# Patient Record
Sex: Male | Born: 1990 | Hispanic: No | Marital: Married | Smoking: Current every day smoker
Health system: Southern US, Community
[De-identification: ages and names within clinical notes are randomized; demographics above are authoritative.]

## PROBLEM LIST (undated history)

## (undated) DIAGNOSIS — I10 Essential (primary) hypertension: Secondary | ICD-10-CM

## (undated) DIAGNOSIS — R251 Tremor, unspecified: Secondary | ICD-10-CM

## (undated) HISTORY — PX: BACK SURGERY: SHX140

## (undated) HISTORY — DX: Tremor, unspecified: R25.1

---

## 2016-11-23 ENCOUNTER — Ambulatory Visit (HOSPITAL_BASED_OUTPATIENT_CLINIC_OR_DEPARTMENT_OTHER): Payer: Worker's Compensation | Admitting: Physical Medicine & Rehabilitation

## 2016-11-23 ENCOUNTER — Encounter: Payer: Self-pay | Admitting: Physical Medicine & Rehabilitation

## 2016-11-23 ENCOUNTER — Encounter: Payer: Worker's Compensation | Attending: Physical Medicine & Rehabilitation

## 2016-11-23 VITALS — BP 141/90 | HR 76

## 2016-11-23 DIAGNOSIS — Z5181 Encounter for therapeutic drug level monitoring: Secondary | ICD-10-CM | POA: Diagnosis not present

## 2016-11-23 DIAGNOSIS — M5416 Radiculopathy, lumbar region: Secondary | ICD-10-CM | POA: Diagnosis not present

## 2016-11-23 DIAGNOSIS — M549 Dorsalgia, unspecified: Secondary | ICD-10-CM | POA: Diagnosis not present

## 2016-11-23 DIAGNOSIS — M961 Postlaminectomy syndrome, not elsewhere classified: Secondary | ICD-10-CM | POA: Diagnosis not present

## 2016-11-23 MED ORDER — BACLOFEN 10 MG PO TABS: 10.0000 mg | ORAL_TABLET | Freq: Three times a day (TID) | ORAL | 1 refills | Status: DC

## 2016-11-23 MED ORDER — PREGABALIN 75 MG PO CAPS: 75.0000 mg | ORAL_CAPSULE | Freq: Two times a day (BID) | ORAL | 1 refills | Status: DC

## 2016-11-23 NOTE — Patient Instructions (Addendum)
You have both muscle spasm pain in the low back area and will restart baclofen 10 mg 3 times a day  You also have nerve pain, right L4 primarily and partially at L3 and L5 Will start Lyrica 75 mg twice a day, but may need to increase this.  If you have problem resting and needs something to help with pain at night after one week of this medication trial, please call our office so we can call in tramadol 50 mg at night  Continue current work restrictions 50 pounds max lifting, no standing for more than 1 hour. These are permanent restrictions. 11/23/2016

## 2016-11-23 NOTE — Progress Notes (Signed)
Subjective:    Patient ID: Ethan Jacobs, male    DOB: 1990-07-26, 26 y.o.   MRN: [id number]  HPI Date of injury 08/27/2015 Chief complaint is low back pain as well as right lower extremity pain from the hip to the toes , excluding fourth and fifth toes. Patient's symptoms were initially mild but then progressed. . Underwent neurosurgical evaluation 11/28/2015. Surgical and nonsurgical options were discussed. Gabapentin was added on to medication regimen. Motor strength was 4/5 in the hip flexor and anterior tibialis on the right side as well as right peroneal. Diagnosed with a right L4 radiculopathy. Was treated with physical therapy and then underwent epidural injection on 12/18/2015 Worsening of pain noted on a physical therapy visit dated 01/01/2016 Patient had some increased weakness and numbness. Was admitted to [facility] on 01/02/2016 Patient had MRI findings of L3-L4 disc protrusion causing severe right lateral recess as well as central spinal canal stenosis. There was impingement of the cauda equina. There was no bowel or bladder incontinence. However. Underwent right L3-4 decompression and microdiscectomy Postoperatively, left lower extremity symptoms resolved.  Right lower extremity symptoms have persisted. Postoperative pain was treated with oxycodone, Robaxin and tizanidine Also received postoperative physical therapy.  Last neurosurgery visit was 08/03/2016. The patient at that time had return to work with restrictions. The patient was not getting good relief with gabapentin or Cymbalta. A prescription for Nucynta was written and patient was maintained on baclofen. The Cymbalta was not tolerated, crossed "crazy dreams". Motor exam was 5/5 bilateral lower extremities. Diagnosed with persistent lumbar radiculopathy causing right sided lower extremity pain. Was felt to be at a maximum medical improvement postsurgically. And was continued on permanent lifting restrictions of 50  pounds and no standing greater than 1 hour. Patient has tried some Tylenol as well as ibuprofen, which gives some partial relief. However, the pain shooting down the right leg, sometimes interrupts sleep at night. Had Baclofen prescribed for spasms Pain Inventory Average Pain 8 Pain Right Now 9 My pain is constant, tingling and aching  In the last 24 hours, has pain interfered with the following? General activity 3 Relation with others 3 Enjoyment of life 3 What TIME of day is your pain at its worst? evening Sleep (in general) Poor  Pain is worse with: walking, bending, sitting and standing Pain improves with: rest and medication Relief from Meds: 8  Mobility walk without assistance ability to climb steps?  yes do you drive?  yes  Function employed # of hrs/week 40  Neuro/Psych numbness tremor tingling trouble walking spasms  Prior Studies Any changes since last visit?  no  Physicians involved in your care Any changes since last visit?  no   Family History  Problem Relation Age of Onset  . Cancer Maternal Grandfather    Social History   Social History  . Marital status: Married    Spouse name: N/A  . Number of children: N/A  . Years of education: N/A   Social History Main Topics  . Smoking status: Current Every Day Smoker    Packs/day: 1.00    Years: 5.00    Types: Cigarettes  . Smokeless tobacco: Never Used  . Alcohol use No  . Drug use: No  . Sexual activity: Yes    Birth control/ protection: Condom   Other Topics Concern  . None   Social History Narrative  . None   Past Surgical History:  Procedure Laterality Date  . BACK SURGERY Bilateral  Past Medical History:  Diagnosis Date  . Tremor    BP (!) 141/90   Pulse 76   SpO2 95%   Opioid Risk Score:   Fall Risk Score:  `1  Depression screen PHQ 2/9  No flowsheet data found.  Review of Systems  Constitutional: Negative.   HENT: Negative.   Eyes: Negative.   Respiratory:  Negative.   Cardiovascular: Negative.   Gastrointestinal: Negative.   Endocrine: Negative.   Genitourinary: Negative.   Musculoskeletal: Negative.   Skin: Negative.   Allergic/Immunologic: Negative.   Neurological: Negative.   Hematological: Negative.   Psychiatric/Behavioral: Negative.   All other systems reviewed and are negative.      Objective:   Physical Exam  Constitutional: He is oriented to person, place, and time. He appears well-developed and well-nourished.  HENT:  Head: Normocephalic and atraumatic.  Eyes: Conjunctivae and EOM are normal. Pupils are equal, round, and reactive to light.  Neck: Normal range of motion.  Cardiovascular: Normal rate, regular rhythm and normal heart sounds.  Exam reveals no friction rub.   No murmur heard. Pulmonary/Chest: Effort normal and breath sounds normal. No respiratory distress. He has no wheezes.  Abdominal: Soft. Bowel sounds are normal. He exhibits no distension. There is no tenderness.  Neurological: He is alert and oriented to person, place, and time.  Reflex Scores:      Patellar reflexes are 1+ on the right side and 1+ on the left side.      Achilles reflexes are 2+ on the right side and 2+ on the left side. Psychiatric: He has a normal mood and affect.  Nursing note and vitals reviewed.  5/5 strength bilateral deltoid, biceps. Passive grip, hip flexion, extension, ankle dorsiflexion. On manual muscle testing, however, on partial squat. He did have some weakness with single leg squat on the right side compared to left side.  Sensation reduced right L3, L4, L5 dermatomal distribution      Assessment & Plan:  1.  Chronic radicular pain. Primarily right L4 but has some findings in the right L3 and right L5 dermatomal distribution. This is residual from the nerve root compression resulting from the L3-L4 disc protrusion We will start Lyrica 75 mg twice a day and explained the probable need to titrate upward from there.  2.  Chronic low back pain. This appears to be peri-incisional and has likely a muscular component. He has had some relief with baclofen in the past and will resume this at 10 mg 3 times a day  Patient states that he has tried a tramadol in the past that has been helpful for him, this may be a treatment option. If the above regimen is effective  Discussed my findings with the patient and then separately with the patient as well as his nurse case manager Lorinda Creedheri Yates RN, CM [phone]

## 2016-11-30 ENCOUNTER — Telehealth: Payer: Self-pay

## 2016-11-30 MED ORDER — TRAMADOL HCL 50 MG PO TABS: 50.0000 mg | ORAL_TABLET | Freq: Every day | ORAL | 0 refills | Status: DC

## 2016-11-30 NOTE — Telephone Encounter (Signed)
Patient called to start using tramadol 50 mg at night, per last note:   If you have problem resting and needs something to help with pain at night after one week of this medication trial, please call our office so we can call in tramadol 50 mg at night  Tramadol 50mg  QHS was prescribed for patient.

## 2016-12-25 ENCOUNTER — Other Ambulatory Visit: Payer: Self-pay | Admitting: Physical Medicine & Rehabilitation

## 2017-01-04 ENCOUNTER — Ambulatory Visit (HOSPITAL_BASED_OUTPATIENT_CLINIC_OR_DEPARTMENT_OTHER): Payer: Worker's Compensation | Admitting: Physical Medicine & Rehabilitation

## 2017-01-04 ENCOUNTER — Encounter: Payer: Worker's Compensation | Attending: Physical Medicine & Rehabilitation

## 2017-01-04 ENCOUNTER — Encounter: Payer: Self-pay | Admitting: Physical Medicine & Rehabilitation

## 2017-01-04 VITALS — BP 142/97 | HR 89

## 2017-01-04 DIAGNOSIS — M961 Postlaminectomy syndrome, not elsewhere classified: Secondary | ICD-10-CM

## 2017-01-04 DIAGNOSIS — M549 Dorsalgia, unspecified: Secondary | ICD-10-CM | POA: Insufficient documentation

## 2017-01-04 DIAGNOSIS — M5416 Radiculopathy, lumbar region: Secondary | ICD-10-CM

## 2017-01-04 DIAGNOSIS — G8928 Other chronic postprocedural pain: Secondary | ICD-10-CM | POA: Diagnosis not present

## 2017-01-04 MED ORDER — TRAMADOL HCL 50 MG PO TABS: 50.0000 mg | ORAL_TABLET | Freq: Two times a day (BID) | ORAL | 1 refills | Status: DC

## 2017-01-04 MED ORDER — BACLOFEN 10 MG PO TABS: 10.0000 mg | ORAL_TABLET | Freq: Two times a day (BID) | ORAL | 1 refills | Status: DC

## 2017-01-04 MED ORDER — PREGABALIN 100 MG PO CAPS: 100.0000 mg | ORAL_CAPSULE | Freq: Two times a day (BID) | ORAL | 1 refills | Status: DC

## 2017-01-04 NOTE — Patient Instructions (Signed)
Will increase Lyrica to 100mg  BID Tramadol 50mg  BID

## 2017-01-04 NOTE — Progress Notes (Signed)
Subjective:    Patient ID: Ethan Jacobs, male    DOB: 1990/08/01, 26 y.o.   MRN: [id number] See initial visit 11/23/2016 ...the patient was admitted to [facility] on 01/02/2016 Patient had MRI findings of L3-L4 disc protrusion causing severe right lateral recess as well as central spinal canal stenosis. There was impingement of the cauda equina. There was no bowel or bladder incontinence. However. Underwent right L3-4 decompression and microdiscectomy Postoperatively, left lower extremity symptoms resolved.  Right lower extremity symptoms have persisted. HPI Lyrica initially helpful but became less effective and takes at 530am and then 12 noon Pain is not as bad at night  Back pain is ~5/10 during the day, takes tramadol only at night  Baclofen 10mg  Rx was lost and not filled.  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 General activity 5 Relation with others 0 Enjoyment of life 7 What TIME of day is your pain at its worst? all Sleep (in general) Fair  Pain is worse with: walking, bending, sitting and standing Pain improves with: medication Relief from Meds: 6  Mobility walk without assistance how many minutes can you walk? 30 ability to climb steps?  yes do you drive?  yes  Function employed # of hrs/week 45  Neuro/Psych weakness numbness tremor tingling spasms  Prior Studies Any changes since last visit?  no  Physicians involved in your care Any changes since last visit?  no   Family History  Problem Relation Age of Onset  . Cancer Maternal Grandfather    Social History   Social History  . Marital status: Married    Spouse name: N/A  . Number of children: N/A  . Years of education: N/A     Social History Main Topics  . Smoking status: Current Every Day Smoker    Packs/day: 1.00    Years: 5.00    Types: Cigarettes  . Smokeless tobacco: Never Used  . Alcohol use No  . Drug use: No  . Sexual activity: Yes    Birth control/ protection: Condom   Other Topics Concern  . None   Social History Narrative  . None   Past Surgical History:  Procedure Laterality Date  . BACK SURGERY Bilateral    Past Medical History:  Diagnosis Date  . Tremor    BP (!) 142/97   Pulse 89   SpO2 96%   Opioid Risk Score:   Fall Risk Score:  `1  Depression screen PHQ 2/9  Depression screen [facility] 2/9 01/04/2017 11/23/2016  Decreased Interest 0 3  Down, Depressed, Hopeless 0 0  PHQ - 2 Score 0 3  Altered sleeping - 3  Tired, decreased energy - 3  Change in appetite - 1  Feeling bad or failure about yourself  - 1  Trouble concentrating - 0  Moving slowly or fidgety/restless - 0  Suicidal thoughts - 0  PHQ-9 Score - 11  Difficult doing work/chores - Very difficult    Review of Systems  HENT: Negative.   Eyes: Negative.   Respiratory: Negative.   Cardiovascular: Negative.   Gastrointestinal: Negative.   Endocrine: Negative.   Genitourinary: Negative.   Musculoskeletal:  Spasms  Skin: Negative.   Allergic/Immunologic: Negative.   Neurological: Positive for weakness and numbness.       Tingling  Hematological: Negative.   Psychiatric/Behavioral: Negative.   All other systems reviewed and are negative.      Objective:   Physical Exam  Constitutional: He is oriented to person, place, and time. He appears well-developed and well-nourished. No distress.  HENT:  Head: Normocephalic and atraumatic.  Eyes: Pupils are equal, round, and reactive to light. Conjunctivae and EOM are normal.  Musculoskeletal:       Thoracic back: He exhibits normal range of motion, no tenderness and no deformity.       Lumbar back: He exhibits normal range of motion, no tenderness and no  deformity.  No tenderness to palpation. Lumbar spine  Neurological: He is alert and oriented to person, place, and time.  Reflex Scores:      Patellar reflexes are 2+ on the right side and 2+ on the left side.      Achilles reflexes are 2+ on the right side and 2+ on the left side. Psychiatric: He has a normal mood and affect. His behavior is normal. Judgment and thought content normal.  Nursing note and vitals reviewed.   Decreased sensation to pinprick vibration, right L3, L4, L5-S1 distribution.      Assessment & Plan:   1. Chronic right lower extremity radicular pain, diagnosed with L4 does have some more diffuse sensory complaints. Getting good relief with Lyrica but need to increase from 75 mg twice a day to 100 mg twice a day. No current side effects.  2. Chronic low back pain. Lumbar postlaminectomy syndrome, tramadol, helping for nighttime pain, but not taken during the day.  will prescribe tramadol 50 g twice a day  Patient also complains of back spasms which occur in the afternoon and evening hours. Will prescribe baclofen 10 mg twice a day  Return to clinic 6 weeks  Called case manager Lorinda Creedheri Yates. Discussed recommendations in presence of the patient. Fax [phone]

## 2017-01-24 ENCOUNTER — Other Ambulatory Visit: Payer: Self-pay | Admitting: Family Medicine

## 2017-01-24 ENCOUNTER — Ambulatory Visit
Admission: RE | Admit: 2017-01-24 | Discharge: 2017-01-24 | Disposition: A | Discharge status: Home / Self Care | Payer: Worker's Compensation | Source: Ambulatory Visit | Attending: Family Medicine | Admitting: Family Medicine

## 2017-01-24 DIAGNOSIS — M545 Low back pain: Secondary | ICD-10-CM

## 2017-01-28 ENCOUNTER — Telehealth: Payer: Self-pay | Admitting: Physical Medicine & Rehabilitation

## 2017-01-28 MED ORDER — MELOXICAM 7.5 MG PO TABS: 7.5000 mg | ORAL_TABLET | Freq: Every day | ORAL | 0 refills | Status: DC

## 2017-01-28 NOTE — Telephone Encounter (Signed)
Notified and order sent to pharmacy.

## 2017-01-28 NOTE — Telephone Encounter (Signed)
Patient is requesting something stronger than Tramadol and Baclofen.  Patient has hurt himself again at work L4,5 and he doesn't have an appointment with his specialist until next week.  Please call patient.

## 2017-01-28 NOTE — Telephone Encounter (Signed)
Please advise.  His next appt here is 8//28/18.

## 2017-01-28 NOTE — Telephone Encounter (Signed)
Call in meloxicam 7.5 milligrams daily, take with food. Continue other current medications, advice to remain active, may use heating pad for 30 minutes every 2 hours as needed

## 2017-02-10 ENCOUNTER — Telehealth: Payer: Self-pay

## 2017-02-10 NOTE — Telephone Encounter (Signed)
Patients NCM called, Lorinda Creed [phone], stating that recently patient has had an exacerbation of pain in his back, he was sent to his neurologist where they did a back X-ray and is recommending an ESI, she has the order and is wanting to know if we can do the injections. Order is being faxed to [place] now.

## 2017-02-10 NOTE — Telephone Encounter (Signed)
We can schedule for epidural steroid injection next available

## 2017-02-11 NOTE — Telephone Encounter (Signed)
Injection scheduled for 8/28 with Dr. Wynn Banker

## 2017-02-15 ENCOUNTER — Encounter: Payer: Self-pay | Admitting: Physical Medicine & Rehabilitation

## 2017-02-15 ENCOUNTER — Encounter: Payer: Worker's Compensation | Attending: Physical Medicine & Rehabilitation

## 2017-02-15 ENCOUNTER — Ambulatory Visit (HOSPITAL_BASED_OUTPATIENT_CLINIC_OR_DEPARTMENT_OTHER): Payer: Worker's Compensation | Admitting: Physical Medicine & Rehabilitation

## 2017-02-15 VITALS — BP 136/93 | HR 97 | Resp 14

## 2017-02-15 DIAGNOSIS — M961 Postlaminectomy syndrome, not elsewhere classified: Secondary | ICD-10-CM

## 2017-02-15 DIAGNOSIS — M549 Dorsalgia, unspecified: Secondary | ICD-10-CM | POA: Insufficient documentation

## 2017-02-15 MED ORDER — TRAMADOL HCL 50 MG PO TABS: 50.0000 mg | ORAL_TABLET | Freq: Two times a day (BID) | ORAL | 1 refills | Status: DC

## 2017-02-15 NOTE — Patient Instructions (Signed)

## 2017-02-15 NOTE — Progress Notes (Signed)
  PROCEDURE RECORD [place]   Name: Ethan Jacobs DOB:[birth year] MRN: [id number]  Date:02/15/2017  Physician: Claudette Laws, MD    Nurse/CMA: Dayshaun Whobrey, CMA  Allergies: Not on File  Consent Signed: Yes.    Is patient diabetic? No.  CBG today?   Pregnant: No. LMP: No LMP for male patient. (age 28-55)  Anticoagulants: no Anti-inflammatory: no Antibiotics: no  Procedure: transforaminal epidural steroid injection  Position: Prone Start Time: 9:16 am  End Time: 9:21am  Fluoro Time: 27  RN/CMA Ryenne Lynam, CMA Sarenity Ramaker, CMA    Time 9:00am 9:29am    BP 139/93 132/91    Pulse 97 87    Respirations 14 14    O2 Sat 97 96    S/S 6 6    Pain Level 9/10 9/10     D/C home with wife, [name] & O X 3, D/C instructions reviewed, and sits independently.

## 2017-02-15 NOTE — Progress Notes (Signed)
Right L4 Lumbar transforaminal epidural steroid injection under fluoroscopic guidance  Indication: Lumbosacral radiculitis is not relieved by medication management or other conservative care and interfering with self-care and mobility.   Informed consent was obtained after describing risk and benefits of the procedure with the patient, this includes bleeding, bruising, infection, paralysis and medication side effects.  The patient wishes to proceed and has given written consent.  Patient was placed in prone position.  The lumbar area was marked and prepped with Betadine.  It was entered with a 25-gauge 1-1/2 inch needle and one mL of 1% lidocaine was injected into the skin and subcutaneous tissue.  Then a 22-gauge 5inch spinal needle was inserted into the Right L4-5 intervertebral foramen under AP, lateral, and oblique view.  Then a solution containing one mL of 10 mg per mL dexamethasone and 2 mL of 1% lidocaine was injected.  The patient tolerated procedure well.  Post procedure instructions were given.  Please see post procedure form. 

## 2017-02-17 ENCOUNTER — Other Ambulatory Visit: Payer: Self-pay | Admitting: Physical Medicine & Rehabilitation

## 2017-02-18 ENCOUNTER — Telehealth: Payer: Self-pay

## 2017-02-18 NOTE — Telephone Encounter (Signed)
[facility] pharmacy called today, stated patient is requesting medication of tramadol too soon, NCCSR pulled and patient filled tramadol on 02/06/17 for 53 tabs and on 01/31/17 for 7 tabs.   Pharmacist states patient told him that he was given permission to increase his dosage to take 1 tab 4 times daily due to increased pain.  No mention on any notes, refills, or phone messages about this increase  Please verify this information.

## 2017-02-21 ENCOUNTER — Other Ambulatory Visit: Payer: Self-pay | Admitting: Physical Medicine & Rehabilitation

## 2017-02-22 NOTE — Telephone Encounter (Signed)
I do not recall recommending a decrease in dose from 2 tramadol per day to 4 tramadol per day. We may call in tramadol 50 mg 3 times a day #90, no refills with one-month follow-up

## 2017-02-23 MED ORDER — TRAMADOL HCL 50 MG PO TABS
50.0000 mg | ORAL_TABLET | Freq: Three times a day (TID) | ORAL | 1 refills | Status: DC
Start: 2017-02-23 — End: 2017-03-14

## 2017-02-24 ENCOUNTER — Other Ambulatory Visit: Payer: Self-pay | Admitting: Physical Medicine & Rehabilitation

## 2017-02-24 NOTE — Telephone Encounter (Signed)
I contacted the patient and educated on being responsible with pain medication. I stressed due not take more than what the doctor prescribes. I ordered the medication and informed.  I failed to mention that he needs to schedule a one month follow up.  I will call him back 02/25/2017

## 2017-02-25 NOTE — Telephone Encounter (Signed)
Contacted patient made an appointment for September 10th at 9:00am

## 2017-02-28 ENCOUNTER — Encounter: Payer: Worker's Compensation | Attending: Physical Medicine & Rehabilitation

## 2017-02-28 ENCOUNTER — Ambulatory Visit: Payer: Worker's Compensation | Admitting: Physical Medicine & Rehabilitation

## 2017-02-28 DIAGNOSIS — M549 Dorsalgia, unspecified: Secondary | ICD-10-CM | POA: Insufficient documentation

## 2017-03-07 ENCOUNTER — Other Ambulatory Visit: Payer: Self-pay | Admitting: Physical Medicine & Rehabilitation

## 2017-03-14 ENCOUNTER — Encounter: Payer: Self-pay | Admitting: Physical Medicine & Rehabilitation

## 2017-03-14 ENCOUNTER — Ambulatory Visit (HOSPITAL_BASED_OUTPATIENT_CLINIC_OR_DEPARTMENT_OTHER): Payer: Worker's Compensation | Admitting: Physical Medicine & Rehabilitation

## 2017-03-14 ENCOUNTER — Encounter: Payer: Worker's Compensation | Attending: Physical Medicine & Rehabilitation

## 2017-03-14 VITALS — BP 141/95 | HR 94

## 2017-03-14 DIAGNOSIS — F17210 Nicotine dependence, cigarettes, uncomplicated: Secondary | ICD-10-CM | POA: Insufficient documentation

## 2017-03-14 DIAGNOSIS — M961 Postlaminectomy syndrome, not elsewhere classified: Secondary | ICD-10-CM | POA: Insufficient documentation

## 2017-03-14 MED ORDER — TRAMADOL HCL 50 MG PO TABS: 100.0000 mg | ORAL_TABLET | Freq: Three times a day (TID) | ORAL | 1 refills | Status: DC

## 2017-03-14 MED ORDER — PREGABALIN 150 MG PO CAPS: 150.0000 mg | ORAL_CAPSULE | Freq: Two times a day (BID) | ORAL | 1 refills | Status: DC

## 2017-03-14 NOTE — Patient Instructions (Signed)
I will review FCE as well as job requirements and can make further adjustments based on that. I do not see any new issues going on. I do think there is some medication changes that we can make such as increasing the Lyrica to 150 mg and doing an L3 epidural on the right side.

## 2017-03-14 NOTE — Progress Notes (Signed)
Subjective:    Patient ID: Ethan Jacobs, male    DOB: 23-Oct-1990, 26 y.o.   MRN: [id number]  HPI No significant improvements in RLE pain after L4 injection Pt states that he hurt his back.   Wet sand and buffing   Had another scan at [facility]  No lifting over 50lb , freq breaks Lyrica  twice a day Meloxicam 7.5mg  per day Baclofen  BID as needed Tramadol  po TID  Pt states his pain is 8/10 at work and 5/10 at rest on weekend  Has episodes of flare ups  Pain Inventory Average Pain 7 Pain Right Now 6 My pain is sharp  In the last 24 hours, has pain interfered with the following? General activity 5 Relation with others 5 Enjoyment of life 5 What TIME of day is your pain at its worst? . Sleep (in general) Fair  Pain is worse with: walking, bending and standing Pain improves with: rest, medication and injections Relief from Meds: .  Mobility walk without assistance  Function employed # of hrs/week 40  Neuro/Psych numbness tingling  Prior Studies Any changes since last visit?  no Result Date: 01/24/2017 CLINICAL DATA: Acute onset low back pain when the patient stood up today. Pain radiates into both hips in the right leg. History of prior lumbar surgery in July, 2017.   EXAM: MRI LUMBAR SPINE WITHOUT CONTRAST   TECHNIQUE: Multiplanar, multisequence MR imaging of the lumbar spine was performed. No intravenous contrast was administered.   COMPARISON: MRI lumbar spine 01/02/2016.   FINDINGS: Segmentation: Standard.   Alignment: Maintained.   Vertebrae: A few Schmorl's nodes are identified. No fracture or worrisome lesion. There is some degenerative endplate signal change posteriorly at L3-4.   Conus medullaris: Extends to the T12-L1 level and appears normal.   Paraspinal and other soft tissues: Negative.   Disc levels:   T12-L1 is imaged in the sagittal plane only and negative.   L1-2: Negative.   L2-3: Negative.   L3-4: The  patient has undergone posterior decompression. Central canal narrowing present on the prior examination is markedly improved. There is a broad-based right paracentral protrusion mildly deforming the ventral aspect of the thecal sac. The disc contacts the descending right L4 root in the subarticular recess. The foramina are open.   L4-5: Mild disc bulge and facet degenerative disease without stenosis. The appearance is unchanged.   L5-S1: Mild facet degenerative disease. No disc bulge or protrusion. The central canal and foramina are open.   Status post posterior decompression at L3-4 since the prior MRI. Broad-based right paracentral protrusion contacts the descending right L4 root without compressing it. The appearance of this level is markedly improved compared to the prior MRI with the thecal sac widely patent.   Mild disc bulge L4-5 without central canal or foraminal stenosis, unchanged.   Electronically Signed By: Drusilla Kanner M.D. On: 01/24/2017 15:29   ED Course     Physicians involved in your care Any changes since last visit?  no   Family History  Problem Relation Age of Onset  . Cancer Maternal Grandfather    Social History   Social History  . Marital status: Married    Spouse name: N/A  . Number of children: N/A  . Years of education: N/A   Social History Main Topics  . Smoking status: Current Every Day Smoker    Packs/day: 1.00    Years: 5.00    Types: Cigarettes  . Smokeless tobacco: Never Used  .  Alcohol use No  . Drug use: No  . Sexual activity: Yes    Birth control/ protection: Condom   Other Topics Concern  . Not on file   Social History Narrative  . No narrative on file   Past Surgical History:  Procedure Laterality Date  . BACK SURGERY Bilateral    Past Medical History:  Diagnosis Date  . Tremor    There were no vitals taken for this visit.  Opioid Risk Score:   Fall Risk Score:  `1  Depression screen PHQ 2/9  Depression screen [facility]  2/9 01/04/2017 11/23/2016  Decreased Interest 0 3  Down, Depressed, Hopeless 0 0  PHQ - 2 Score 0 3  Altered sleeping - 3  Tired, decreased energy - 3  Change in appetite - 1  Feeling bad or failure about yourself  - 1  Trouble concentrating - 0  Moving slowly or fidgety/restless - 0  Suicidal thoughts - 0  PHQ-9 Score - 11  Difficult doing work/chores - Very difficult     Review of Systems  Constitutional: Negative.   HENT: Negative.   Eyes: Negative.   Respiratory: Negative.   Cardiovascular: Negative.   Gastrointestinal: Negative.   Endocrine: Negative.   Genitourinary: Negative.   Musculoskeletal: Negative.   Skin: Negative.   Allergic/Immunologic: Negative.   Neurological: Negative.   Hematological: Negative.   Psychiatric/Behavioral: Negative.   All other systems reviewed and are negative.      Objective:   Physical Exam  Constitutional: He is oriented to person, place, and time. He appears well-developed and well-nourished.  HENT:  Head: Normocephalic and atraumatic.  Eyes: Pupils are equal, round, and reactive to light. Conjunctivae and EOM are normal.  Neck: Normal range of motion.  Musculoskeletal:       Lumbar back: He exhibits decreased range of motion. He exhibits no tenderness, no deformity and no spasm.  Neurological: He is alert and oriented to person, place, and time. He displays no atrophy. He exhibits normal muscle tone. Coordination and gait normal.  Negative straight leg raising  Psychiatric: He has a normal mood and affect.  Nursing note and vitals reviewed.   Reduced pinprick sensation right L3, L4, L5-S1. Strength 5/5 hip flexor, knee extensor, dorsiflexor Deep tendon reflexes 2 plus bilateral knees and ankles. Lumbar spine range of motion 75% flexion, extension, lateral bending and rotation      Assessment & Plan:   1. Lumbar post laminectomy syndrome, status post L3-4 microdiscectomy and decompression on the right with chronic axial  back pain as well as sciatic symptoms that are not confined to operative level Performed L4-5 transforaminal epidural last month. Given MRI findings of some potential impingement on L4 nerve root. This was not helpful, we'll go up to L3-4 level, targeting the L3 nerve. We discussed increasing his medications, Lyrica 150 twice a day Tramadol 100 mg 3 times a day We discussed his work activities, which include frequent bending. It is unclear whether he has had prior FCE, nurse case manager was asked about this, and she will look into this. Also, I need a copy of his job description. From a theoretic basis, frequent bending in a patient with disc issues is likely to cause increased pain. We will not make any changes to the current restrictions prescribed by his neurosurgeon. I will need to get these current restrictions, patient remembers that he has been told not to lift more than 50 pounds and to take rest breaks as needed.  I  first discussed findings and recommendations with the patient then called and his nurse case manager  Patient would like to have more restrictions placed on him to protect his job, once again discussed this with patient and nurse case manager and will need to get the information listed above. Fax to [place] [phone] 3 4-0 5 5 2

## 2017-03-17 ENCOUNTER — Other Ambulatory Visit: Payer: Self-pay | Admitting: Physical Medicine & Rehabilitation

## 2017-03-17 ENCOUNTER — Telehealth: Payer: Self-pay

## 2017-03-17 MED ORDER — TRAMADOL HCL 50 MG PO TABS: 100.0000 mg | ORAL_TABLET | Freq: Three times a day (TID) | ORAL | 0 refills | Status: DC

## 2017-03-17 NOTE — Telephone Encounter (Signed)
[place] PMP Aware checked ([place]) and his last fill was #90 on 02/23/17.  I called and verified with his [facility] that was correct.  They do not have any other orders for tramadol.  Ethan Jacobs was here for appt with Dr Wynn Banker on 03/14/17 and was ordered new Rx for #180 with 1 refill but system was on phone in and this did not get called to the pharmacy.  Therefore I will call the one month supply of Tramadol 50 mg 2 tablets three times per day #180 to the [facility] in [place].  He will be due a refill for October but will have appt with Dr Wynn Banker on 04/11/17 at which time he can get refill. His wife was notified.
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
## 2017-04-11 ENCOUNTER — Ambulatory Visit: Payer: Self-pay | Admitting: Physical Medicine & Rehabilitation

## 2017-04-21 ENCOUNTER — Ambulatory Visit: Payer: Self-pay | Admitting: Physical Medicine & Rehabilitation

## 2017-04-21 ENCOUNTER — Encounter: Payer: Worker's Compensation | Attending: Physical Medicine & Rehabilitation

## 2017-04-21 DIAGNOSIS — M549 Dorsalgia, unspecified: Secondary | ICD-10-CM | POA: Insufficient documentation

## 2017-05-19 ENCOUNTER — Ambulatory Visit: Payer: Worker's Compensation | Admitting: Physical Medicine & Rehabilitation

## 2017-05-20 ENCOUNTER — Telehealth: Payer: Self-pay | Admitting: Physical Medicine & Rehabilitation

## 2017-05-20 ENCOUNTER — Other Ambulatory Visit: Payer: Self-pay

## 2017-05-20 MED ORDER — TRAMADOL HCL 50 MG PO TABS: 100.0000 mg | ORAL_TABLET | Freq: Three times a day (TID) | ORAL | 0 refills | Status: DC

## 2017-05-20 NOTE — Telephone Encounter (Signed)
Medication refilled with instructions to keep next appointment for any more further refills of it, called patient and notified him of refill.  Called pharmacy and ordered medication
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
## 2017-05-29 ENCOUNTER — Other Ambulatory Visit: Payer: Self-pay | Admitting: Physical Medicine & Rehabilitation

## 2017-06-20 ENCOUNTER — Encounter: Payer: Self-pay | Admitting: Physical Medicine & Rehabilitation

## 2017-06-20 ENCOUNTER — Encounter: Payer: Worker's Compensation | Attending: Physical Medicine & Rehabilitation

## 2017-06-20 ENCOUNTER — Ambulatory Visit: Payer: Worker's Compensation | Admitting: Physical Medicine & Rehabilitation

## 2017-06-20 ENCOUNTER — Ambulatory Visit (HOSPITAL_BASED_OUTPATIENT_CLINIC_OR_DEPARTMENT_OTHER): Payer: Worker's Compensation | Admitting: Physical Medicine & Rehabilitation

## 2017-06-20 VITALS — BP 155/109 | HR 94

## 2017-06-20 DIAGNOSIS — M5416 Radiculopathy, lumbar region: Secondary | ICD-10-CM

## 2017-06-20 DIAGNOSIS — M961 Postlaminectomy syndrome, not elsewhere classified: Secondary | ICD-10-CM | POA: Diagnosis not present

## 2017-06-20 DIAGNOSIS — M549 Dorsalgia, unspecified: Secondary | ICD-10-CM | POA: Diagnosis present

## 2017-06-20 MED ORDER — BACLOFEN 10 MG PO TABS: 10.0000 mg | ORAL_TABLET | Freq: Two times a day (BID) | ORAL | 0 refills | Status: DC

## 2017-06-20 MED ORDER — TRAMADOL HCL 50 MG PO TABS: 100.0000 mg | ORAL_TABLET | Freq: Three times a day (TID) | ORAL | 1 refills | Status: DC

## 2017-06-20 MED ORDER — PREGABALIN 200 MG PO CAPS: 200.0000 mg | ORAL_CAPSULE | Freq: Two times a day (BID) | ORAL | 2 refills | Status: DC

## 2017-06-20 NOTE — Progress Notes (Signed)
  PROCEDURE RECORD [facility]   Name: Ethan Jacobs DOB:02/02/1991 MRN: [id number]  Date:06/20/2017  Physician: Claudette LawsAndrew Kirsteins, MD    Nurse/CMA: Roselie Cirigliano CMA  Allergies: Not on File  Consent Signed: Yes.    Is patient diabetic? No.  CBG today? NA  Pregnant: No. LMP: No LMP for male patient. (age 28-55)  Anticoagulants: no Anti-inflammatory: yes (mobic, yesterday) Antibiotics: no  Procedure: caudal injection Position: Prone   Start Time:  245pm  End Time:    Fluoro Time:   RN/CMA Rocky Gladden CMA Laryssa Hassing CMA    Time 228pm 256pm    BP 155/109 151/103    Pulse 94 97    Respirations 16 16    O2 Sat 98 97    S/S 6 6    Pain Level 5/10 4/10     D/C home with wife, [name] & O X 3, D/C instructions reviewed, and sits independently.

## 2017-06-20 NOTE — Progress Notes (Signed)
Caudal epidural injection Informed consent was obtained after describing risks and benefits of the procedure the patient is include bleeding bruising and infection she elects to proceed and has given written consent. Patient placed prone on fluoroscopy table Betadine prep Sterile drape 25-gauge 1.5 inch needle was used to anesthetize the skin and subcutaneous tissue after identifying the sacral hiatus on fluoroscopic AP views Then a 22-gauge 3.5 inch needle was inserted into the sacral hiatus. Lateral views were utilized to maneuver the needle into the sacral canal Under AP views 3 cc of Omnipaque 180 injected demonstrating appropriate spread of injectate Then a solution containing 12 mg of Celestone and 3 cc of 1% MPF of lidocaine were injected. Patient tolerated procedure well Post procedure instructions given  Had a discussion with the patient and patient's case manager Mrs. Eggers regarding medication management spinal injection procedure plans as well as the restrictions placed upon the patient for work. Patient states that the meloxicam has not been helpful.  The neurogenic pain is no longer well controlled by the Lyrica 150 mg twice daily The tramadol 100 mg 3 times daily is helpful for his pain The baclofen 10 mg twice daily is also helpful for some of the muscle spasm but this is taken more on a as needed basis. The following medication changes have been made Discontinue meloxicam Increase Lyrica to 200 mg twice a day Continue tramadol 100 mg 3 times daily Continue baclofen 100 g twice daily as needed  Will repeat injection in 3 months consider shifting focus to axial back pain rather than radicular if the caudal epidural steroid injection was not very helpful.  Restrictions as per neurosurgery.  Will ask case manager to forward the note from the neurosurgeon that specifies work restrictions.  If the patient has not had an FCE this will be needed once the patient gets to [facility] from a  pain management standpoint.  Fax to [phone], attention Lorinda Creedheri Yates RN

## 2017-06-20 NOTE — Patient Instructions (Addendum)

## 2017-06-23 ENCOUNTER — Other Ambulatory Visit: Payer: Self-pay | Admitting: Physical Medicine & Rehabilitation

## 2017-07-19 ENCOUNTER — Other Ambulatory Visit: Payer: Self-pay | Admitting: Physical Medicine & Rehabilitation

## 2017-07-20 ENCOUNTER — Other Ambulatory Visit: Payer: Self-pay | Admitting: *Deleted

## 2017-07-20 MED ORDER — TRAMADOL HCL 50 MG PO TABS: 100.0000 mg | ORAL_TABLET | Freq: Three times a day (TID) | ORAL | 1 refills | Status: DC

## 2017-07-20 MED ORDER — PREGABALIN 200 MG PO CAPS: 200.0000 mg | ORAL_CAPSULE | Freq: Two times a day (BID) | ORAL | 1 refills | Status: DC

## 2017-09-19 ENCOUNTER — Ambulatory Visit: Payer: Worker's Compensation | Admitting: Physical Medicine & Rehabilitation

## 2017-10-03 ENCOUNTER — Encounter: Payer: Worker's Compensation | Attending: Physical Medicine & Rehabilitation

## 2017-10-03 ENCOUNTER — Ambulatory Visit (HOSPITAL_BASED_OUTPATIENT_CLINIC_OR_DEPARTMENT_OTHER): Payer: Worker's Compensation | Admitting: Physical Medicine & Rehabilitation

## 2017-10-03 ENCOUNTER — Encounter: Payer: Self-pay | Admitting: Physical Medicine & Rehabilitation

## 2017-10-03 VITALS — BP 141/91 | HR 84 | Resp 14 | Ht 75.0 in | Wt 334.0 lb

## 2017-10-03 DIAGNOSIS — M5417 Radiculopathy, lumbosacral region: Secondary | ICD-10-CM | POA: Diagnosis present

## 2017-10-03 DIAGNOSIS — M5416 Radiculopathy, lumbar region: Secondary | ICD-10-CM

## 2017-10-03 DIAGNOSIS — G894 Chronic pain syndrome: Secondary | ICD-10-CM

## 2017-10-03 DIAGNOSIS — Z5181 Encounter for therapeutic drug level monitoring: Secondary | ICD-10-CM

## 2017-10-03 DIAGNOSIS — Z79891 Long term (current) use of opiate analgesic: Secondary | ICD-10-CM

## 2017-10-03 MED ORDER — TRAMADOL HCL 50 MG PO TABS: 100.0000 mg | ORAL_TABLET | Freq: Two times a day (BID) | ORAL | 2 refills | Status: DC

## 2017-10-03 MED ORDER — PREGABALIN 300 MG PO CAPS: 300.0000 mg | ORAL_CAPSULE | Freq: Two times a day (BID) | ORAL | 5 refills | Status: DC

## 2017-10-03 NOTE — Procedures (Signed)
Right L4-5 Lumbar transforaminal epidural steroid injection under fluoroscopic guidance  Indication: Lumbosacral radiculitis is not relieved by medication management or other conservative care and interfering with self-care and mobility.   Informed consent was obtained after describing risk and benefits of the procedure with the patient, this includes bleeding, bruising, infection, paralysis and medication side effects.  The patient wishes to proceed and has given written consent.  Patient was placed in prone position.  The lumbar area was marked and prepped with Betadine.  It was entered with a 25-gauge 1-1/2 inch needle and one mL of 1% lidocaine was injected into the skin and subcutaneous tissue.  Then a 22-gauge 5in spinal needle was inserted into the Right L4-5 intervertebral foramen under AP, lateral, and oblique view.  Then a solution containing one mL of 10 mg per mL dexamethasone and 2 mL of 1% lidocaine was injected.  The patient tolerated procedure well.  Post procedure instructions were given.  Please see post procedure form. 

## 2017-10-03 NOTE — Patient Instructions (Signed)

## 2017-10-03 NOTE — Progress Notes (Signed)
  PROCEDURE RECORD [facility]   Name: Ethan Jacobs DOB:10/03/1990 MRN: [id number]  Date:10/03/2017  Physician: Claudette LawsAndrew Kirsteins, MD    Nurse/CMA: Andrea Colglazier, CMA   Allergies: Not on File  Consent Signed: Yes.    Is patient diabetic? No.  CBG today?   Pregnant: No. LMP: No LMP for male patient. (age 29-55)  Anticoagulants: no Anti-inflammatory: no Antibiotics: no  Procedure: right transforaminal epidural steroid injection  Position: Prone Start Time: 1:28pm  End Time: 1:34pm  Fluoro Time: 29s  RN/CMA Jax Kentner, CMA Pricella Gaugh, CMA    Time 1:15pm 1:40pm    BP 141/91 149/96    Pulse 84 92    Respirations 16 16    O2 Sat 97 97    S/S 6 6    Pain Level 6/10 6/10     D/C home with mother, [name] & O X 3, D/C instructions reviewed, and sits independently.

## 2017-10-04 NOTE — Progress Notes (Signed)
Right L 4 Transforaminal   Lumbar transforaminal epidural steroid injection under fluoroscopic guidance  Indication: Lumbosacral radiculitis is not relieved by medication management or other conservative care and interfering with self-care and mobility.   Informed consent was obtained after describing risk and benefits of the procedure with the patient, this includes bleeding, bruising, infection, paralysis and medication side effects.  The patient wishes to proceed and has given written consent.  Patient was placed in prone position.  The lumbar area was marked and prepped with Betadine.  It was entered with a 25-gauge 1-1/2 inch needle and one mL of 1% lidocaine was injected into the skin and subcutaneous tissue.  Then a 22-gauge 5In spinal needle was inserted into the Right L4-5 intervertebral foramen under AP, lateral, and oblique view.  Then a solution containing one mL of 10 mg per mL dexamethasone and 2 mL of 1% lidocaine was injected.  The patient tolerated procedure well.  Post procedure instructions were given.  Please see post procedure form.

## 2017-10-06 ENCOUNTER — Telehealth: Payer: Self-pay | Admitting: *Deleted

## 2017-10-06 ENCOUNTER — Encounter: Payer: Self-pay | Admitting: Physical Medicine & Rehabilitation

## 2017-10-06 NOTE — Telephone Encounter (Signed)
This is in regards to the letter that you composed during his visit. They are asking for a revised letter that includes what was previously stated plus addressing the following:  1. No concern of abusing medication 2. Taking medication only at night and not while driving 3. Tolerating medications well with few adverse side effects or complaints with meds. 4. Having regular f/u  5. Name condition for treatment  6. Note that there is no periphiral neuropathy associated with condition  This has nothing to do with a physical per se. Just a revised letter

## 2017-10-06 NOTE — Telephone Encounter (Signed)
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  I thought this was just a letter regarding his medications that I prescribed.  Please clarify.  He would need to go to a doctor for a DOT physical.  This is usually occupational medicine or primary care

## 2017-10-06 NOTE — Telephone Encounter (Signed)
Patients wife left a message stating that the letter written for patient regarding DOT physical is insufficient.  She asking for a call back to give details. I contacted patient and left a voicemail to call back with details

## 2017-10-06 NOTE — Telephone Encounter (Signed)
Wife called back stating the DOT letter needs more information including what was already on the letter.  Information to add 1. No concern of abusing medication 2. Taking medication only at night and not while driving 3. Tolerating medications well with few adverse side effects or complaints with meds. 4. Having regular f/u  5. Name condition for treatment  6. Note that there is no periphiral neuropathy associated with condition  email to [email or web address]

## 2017-10-07 ENCOUNTER — Encounter: Payer: Self-pay | Admitting: Physical Medicine & Rehabilitation

## 2017-10-07 LAB — 6-ACETYLMORPHINE,TOXASSURE ADD
6-ACETYLMORPHINE: NEGATIVE
6-ACETYLMORPHINE: NOT DETECTED ng/mg{creat}

## 2017-10-07 LAB — TOXASSURE SELECT,+ANTIDEPR,UR

## 2017-10-12 ENCOUNTER — Telehealth: Payer: Self-pay | Admitting: *Deleted

## 2017-10-12 NOTE — Telephone Encounter (Signed)
Urine drug screen for this encounter is inconsistent.  There is codiene with morphine metabolites and hydrocodone with its metabolites present as well as his prescribed tramadol.

## 2017-10-14 ENCOUNTER — Other Ambulatory Visit: Payer: Self-pay | Admitting: Physical Medicine & Rehabilitation

## 2017-10-16 NOTE — Telephone Encounter (Signed)
Pt will not receive any more tramadol from this office

## 2017-10-19 NOTE — Telephone Encounter (Signed)
Letter written informing Mr Newsome of urine results and Dr Wynn Banker decision to no longer prescribe.

## 2017-12-03 ENCOUNTER — Other Ambulatory Visit: Payer: Self-pay | Admitting: Physical Medicine & Rehabilitation

## 2018-01-02 ENCOUNTER — Encounter: Payer: Self-pay | Admitting: Physical Medicine & Rehabilitation

## 2018-01-02 ENCOUNTER — Telehealth: Payer: Self-pay | Admitting: Physical Medicine & Rehabilitation

## 2018-01-02 ENCOUNTER — Ambulatory Visit (HOSPITAL_BASED_OUTPATIENT_CLINIC_OR_DEPARTMENT_OTHER): Payer: Worker's Compensation | Admitting: Physical Medicine & Rehabilitation

## 2018-01-02 ENCOUNTER — Encounter: Payer: Worker's Compensation | Attending: Physical Medicine & Rehabilitation

## 2018-01-02 VITALS — BP 162/110 | HR 81 | Resp 14 | Ht 75.0 in | Wt 329.0 lb

## 2018-01-02 DIAGNOSIS — M5416 Radiculopathy, lumbar region: Secondary | ICD-10-CM | POA: Diagnosis not present

## 2018-01-02 DIAGNOSIS — G8929 Other chronic pain: Secondary | ICD-10-CM | POA: Diagnosis present

## 2018-01-02 DIAGNOSIS — M961 Postlaminectomy syndrome, not elsewhere classified: Secondary | ICD-10-CM | POA: Diagnosis not present

## 2018-01-02 MED ORDER — BACLOFEN 10 MG PO TABS: 10.0000 mg | ORAL_TABLET | Freq: Two times a day (BID) | ORAL | 5 refills | Status: DC

## 2018-01-02 NOTE — Progress Notes (Signed)
  PROCEDURE RECORD [facility] [facility]   Name: Ethan Jacobs DOB:10/11/1990 MRN: [id number]  Date:01/02/2018  Physician: Claudette LawsAndrew Kirsteins, MD    Nurse/CMA: Maddalena Linarez, CMA  Allergies: Not on File  Consent Signed: Yes.    Is patient diabetic? No.  CBG today?   Pregnant: No. LMP: No LMP for male patient. (age 29-55)  Anticoagulants: no Anti-inflammatory: no Antibiotics: no  Procedure: right transforaminal epidural sterloid injection  Position: Prone Start Time: 9:26 am  End Time: 9:32am        Fluoro Time: 33  RN/CMA Kees Idrovo, CMA Dominika Losey, CMA    Time 8:55am 9:37am    BP 162/110 159/115    Pulse 81 72    Respirations 14 14    O2 Sat 98 98    S/S 6 6    Pain Level 7/10 6/10     D/C home with wife, [name] & O X 3, D/C instructions reviewed, and sits independently.

## 2018-01-02 NOTE — Patient Instructions (Signed)

## 2018-01-02 NOTE — Progress Notes (Signed)
Lumbar transforaminal epidural steroid injection under fluoroscopic guidance  Indication: Lumbosacral radiculitis is not relieved by medication management or other conservative care and interfering with self-care and mobility.   Informed consent was obtained after describing risk and benefits of the procedure with the patient, this includes bleeding, bruising, infection, paralysis and medication side effects.  The patient wishes to proceed and has given written consent.  Patient was placed in prone position.  The lumbar area was marked and prepped with Betadine.  It was entered with a 25-gauge 1-1/2 inch needle and one mL of 1% lidocaine was injected into the skin and subcutaneous tissue.  Then a 22-gauge 5inch spinal needle was inserted into the Right L3-4 intervertebral foramen under AP, lateral, and oblique view.  Then a solution containing one mL of 10 mg per mL dexamethasone and 2 mL of 1% lidocaine was injected.  The patient tolerated procedure well.  Post procedure instructions were given.  Please see post procedure form.

## 2018-01-02 NOTE — Telephone Encounter (Signed)
New Message  Pts Medical Case Manager verbalized needing visit notes from today's visit faxed to [phone].

## 2018-01-05 ENCOUNTER — Telehealth: Payer: Self-pay

## 2018-01-05 DIAGNOSIS — G894 Chronic pain syndrome: Secondary | ICD-10-CM

## 2018-01-05 DIAGNOSIS — M5416 Radiculopathy, lumbar region: Secondary | ICD-10-CM

## 2018-01-05 DIAGNOSIS — M961 Postlaminectomy syndrome, not elsewhere classified: Secondary | ICD-10-CM

## 2018-01-05 NOTE — Telephone Encounter (Signed)
Patient called today and left voicemail, accidentally erased voicemail when trying to retrieve it, called patient back to retrieve message, no answer, left voicemail to call back with message.

## 2018-01-05 NOTE — Telephone Encounter (Signed)
Please order lumbar MRI with and without contrast.  Lumbar radiculopathy is diagnosis.  Also lumbar postlaminectomy syndrome

## 2018-01-05 NOTE — Telephone Encounter (Signed)
Pt called stating that he had a procedure done on Monday 01/02/18 and was told to call back if pain no better and a scan would be ordered for him. He states pain is worse. He also requested a refill on Tramadol, last filled 12/08/17 from prescription written on 10/03/17 with 2 refill. Note from 10/12/17 says letter written for non-narcotic.

## 2018-01-05 NOTE — Telephone Encounter (Signed)
is indeed nonnarcotic therefore tramadol will be refused.

## 2018-01-05 NOTE — Telephone Encounter (Signed)
MRI has been ordered for patient using specified orders from Dr. Wynn BankerKirsteins.  Patient is also requesting Tramadol refills as well despite a letter should have been mailed to the patient informing him of NON-NARCOTIC status.  Please advise

## 2018-01-06 MED ORDER — MELOXICAM 15 MG PO TABS: 15.0000 mg | ORAL_TABLET | Freq: Every day | ORAL | 0 refills | Status: DC

## 2018-01-06 NOTE — Addendum Note (Signed)
Addended by: Doreene ElandSHUMAKER, Lerlene Treadwell W on: 01/06/2018 09:06 AM   Modules accepted: Orders

## 2018-01-06 NOTE — Telephone Encounter (Signed)
Order placed and Mr Hritz notified.

## 2018-01-06 NOTE — Addendum Note (Signed)
Addended by: Doreene ElandSHUMAKER, Kale Rondeau W on: 01/06/2018 02:18 PM   Modules accepted: Orders

## 2018-01-06 NOTE — Telephone Encounter (Signed)
We can prescribe an anti-inflammatory such as meloxicam 15 mg/day

## 2018-01-06 NOTE — Telephone Encounter (Addendum)
Ethan Jacobs notified about the non narcotic status and MRI order . (He says he did not receive his letter). He is asking if there is anything else that can be prescribed?.Marland Kitchen

## 2018-01-10 ENCOUNTER — Encounter: Payer: Worker's Compensation | Attending: Physical Medicine & Rehabilitation

## 2018-01-10 ENCOUNTER — Ambulatory Visit (HOSPITAL_BASED_OUTPATIENT_CLINIC_OR_DEPARTMENT_OTHER): Payer: Worker's Compensation | Admitting: Physical Medicine & Rehabilitation

## 2018-01-10 ENCOUNTER — Encounter: Payer: Self-pay | Admitting: Physical Medicine & Rehabilitation

## 2018-01-10 ENCOUNTER — Telehealth: Payer: Self-pay | Admitting: Physical Medicine & Rehabilitation

## 2018-01-10 VITALS — BP 146/88 | HR 102 | Ht 75.0 in | Wt 327.0 lb

## 2018-01-10 DIAGNOSIS — M961 Postlaminectomy syndrome, not elsewhere classified: Secondary | ICD-10-CM | POA: Diagnosis not present

## 2018-01-10 DIAGNOSIS — M5416 Radiculopathy, lumbar region: Secondary | ICD-10-CM

## 2018-01-10 DIAGNOSIS — G8929 Other chronic pain: Secondary | ICD-10-CM | POA: Diagnosis present

## 2018-01-10 DIAGNOSIS — F17210 Nicotine dependence, cigarettes, uncomplicated: Secondary | ICD-10-CM | POA: Diagnosis not present

## 2018-01-10 NOTE — Progress Notes (Signed)
Subjective:    Patient ID: Ethan Jacobs, male    DOB: 1990-07-17, 27 y.o.   MRN: [id number] Date of injury 08/27/2015 Chief complaint is low back pain as well as right lower extremity pain from the hip to the toes , excluding fourth and fifth toes. Patient's symptoms were initially mild but then progressed. . Underwent neurosurgical evaluation 11/28/2015. Surgical and nonsurgical options were discussed. Gabapentin was added on to medication regimen. Motor strength was 4/5 in the hip flexor and anterior tibialis on the right side as well as right peroneal. Diagnosed with a right L4 radiculopathy. Was treated with physical therapy and then underwent epidural injection on 12/18/2015 Worsening of pain noted on a physical therapy visit dated 01/01/2016 Patient had some increased weakness and numbness. Was admitted to [facility] on 01/02/2016 Patient had MRI findings of L3-L4 disc protrusion causing severe right lateral recess as well as central spinal canal stenosis. There was impingement of the cauda equina. There was no bowel or bladder incontinence. However. Underwent right L3-4 decompression and microdiscectomy Postoperatively, left lower extremity symptoms resolved.  Right lower extremity symptoms have persisted. Postoperative pain was treated with oxycodone, Robaxin and tizanidine Also received postoperative physical therapy. HPI  Hx Chronic RLE pain initially affecting entire RLE to the foot. Pain has increased and changed in location over the last month No fall or injury.  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 L4-L5 ESI performed 10/03/2017, 02/15/2017 gave partial improvement of pain that radiated into the right foot And caudal epidural injection performed 06/20/2017 did not give much relief of  foot pain on the right side  Patient had been receiving good relief of his right lower extremity pain on Lyrica 300 mg twice daily. He was initially prescribed tramadol by myself as well however urine drug screen 10/03/2017 showed that he had been also taking hydrocodone and codeine.  These medications were not prescribed by me and did not show up in the PMP aware website. Discussed with patient that he will no longer be prescribed narcotic analgesic medications through this office. Pain Inventory Average Pain 6 Pain Right Now 9 My pain is constant, sharp, burning, dull, stabbing, tingling and aching  In the last 24 hours, has pain interfered with the following? General activity 9 Relation with others 9 Enjoyment of life 9 What TIME of day is your pain at its worst? morning Sleep (in general) Poor  Pain is worse with: walking, bending, sitting, standing and some activites Pain improves with: rest and heat/ice Relief from Meds: na  Mobility walk without assistance ability to climb steps?  yes do you drive?  yes  Function employed # of hrs/week 16  Neuro/Psych numbness tremor tingling spasms  Prior Studies Any changes since last visit?  no  Physicians involved in your care Any changes since last visit?  no   Family History  Problem Relation Age of Onset  . Cancer Maternal Grandfather    Social History   Socioeconomic History  . Marital status: Married    Spouse name: Not on file  . Number of children: Not on file  . Years of education: Not on file  . Highest education level: Not on file  Occupational History  . Not on file  Social Needs  . Physicist, medical  strain: Not on file  . Food insecurity:    Worry: Not on file    Inability: Not on file  . Transportation needs:    Medical: Not on file    Non-medical: Not on file  Tobacco Use  . Smoking status: Current Every Day Smoker    Packs/day: 1.00    Years: 5.00    Pack years: 5.00    Types: Cigarettes    . Smokeless tobacco: Never Used  Substance and Sexual Activity  . Alcohol use: No  . Drug use: No  . Sexual activity: Yes    Birth control/protection: Condom  Lifestyle  . Physical activity:    Days per week: Not on file    Minutes per session: Not on file  . Stress: Not on file  Relationships  . Social connections:    Talks on phone: Not on file    Gets together: Not on file    Attends religious service: Not on file    Active member of club or organization: Not on file    Attends meetings of clubs or organizations: Not on file    Relationship status: Not on file  Other Topics Concern  . Not on file  Social History Narrative  . Not on file   Past Surgical History:  Procedure Laterality Date  . BACK SURGERY Bilateral    Past Medical History:  Diagnosis Date  . Tremor    There were no vitals taken for this visit.  Opioid Risk Score:   Fall Risk Score:  `1  Depression screen PHQ 2/9  Depression screen [facility] 2/9 01/04/2017 11/23/2016  Decreased Interest 0 3  Down, Depressed, Hopeless 0 0  PHQ - 2 Score 0 3  Altered sleeping - 3  Tired, decreased energy - 3  Change in appetite - 1  Feeling bad or failure about yourself  - 1  Trouble concentrating - 0  Moving slowly or fidgety/restless - 0  Suicidal thoughts - 0  PHQ-9 Score - 11  Difficult doing work/chores - Very difficult     Review of Systems  Constitutional: Negative.   HENT: Negative.   Eyes: Negative.   Respiratory: Negative.   Cardiovascular: Negative.   Gastrointestinal: Positive for abdominal pain, constipation and nausea.  Endocrine: Negative.   Genitourinary: Negative.   Musculoskeletal: Positive for arthralgias, back pain and myalgias.  Skin: Negative.   Allergic/Immunologic: Negative.   Neurological: Negative.   Hematological: Negative.   Psychiatric/Behavioral: Negative.        Objective:   Physical Exam  Constitutional: He is oriented to person, place, and time. He appears  well-developed and well-nourished. No distress.  HENT:  Head: Normocephalic and atraumatic.  Eyes: Pupils are equal, round, and reactive to light. EOM are normal.  Neurological: He is alert and oriented to person, place, and time.  Skin: He is not diaphoretic.  Nursing note and vitals reviewed.  Motor strength is 5/5 bilateral deltoid bicep tricep grip bilateral hip flexor hip adductor hip abductor bilateral knee extensor. Ankle dorsiflexor 4/5 right 5/5 left Gait is without evidence of toe drag or knee instability.  He does not require assistive device Sensation intact to pinprick left L2 L3-L4-L5 Patient indicates that right L2 L3-L4-L5 pinprick is reduced Negative straight leg raising Deep tendon reflexes 1+ bilateral knees 2+ bilateral ankles Back is mild tenderness palpation bilateral L4-L5 paraspinal region. Lumbar range of motion is 50% flexion extension lateral bending and rotation each direction accompanied by pain  Assessment & Plan:  1.  Lumbar postlaminectomy syndrome status post L3-4 decompression, he has been complaining of chronic symptoms of right lower extremity pain going down to the foot attributed to chronic L4 radiculopathy.  Patient complains of increased back pain as well as pain that radiates into both thighs.  For this reason we will check MRI lumbar spine with and without contrast.  Will ask for surgical reevaluation as well. Until I can review his MRI I will take him out of work. If the MRI does not look substantially different than prior he may return to work without restrictions. Discussed with patient agrees with plan Possible that increased pain may be related to coming off his tramadol.  We will continue his Mobic 15 mg/day Continue Lyrica 300 mg twice daily

## 2018-01-10 NOTE — Telephone Encounter (Signed)
New Message  Pts wife verbalized needing work note and MRI order faxed to attorney/lawyer  [phone] [facility]

## 2018-01-10 NOTE — Telephone Encounter (Signed)
THants find please get it off epic, I wrote a letter today, he has copy may reprint

## 2018-01-11 ENCOUNTER — Encounter: Payer: Self-pay | Admitting: *Deleted

## 2018-01-11 NOTE — Telephone Encounter (Signed)
I looked for the letter. The last posted letter is dated 01/02/2018.  I'm not seeing a letter dated from yesterdays visit.

## 2018-01-12 NOTE — Telephone Encounter (Signed)
I gave pt letter he can fax it to his lawyer

## 2018-01-12 NOTE — Telephone Encounter (Signed)
Contacted patient and informed, according to Dr. Wynn BankerKirsteins, that a letter was provided on the day of visit. Urged patient to call us back to confirm if this is true. Forwarded message to Sigmund HazelLisa Miller to handle attorneys request for clinic notes and MRI order.

## 2018-01-12 NOTE — Telephone Encounter (Signed)
Patient called back.  I encouraged patient to go back to his employer and make a copy of the letter. I instructed him to stop by our office with the letter to drop off and signe a release of medical information form so we can send them to the person of his choosing.  Patient verbalized understanding

## 2018-01-16 ENCOUNTER — Telehealth: Payer: Self-pay | Admitting: *Deleted

## 2018-01-16 NOTE — Telephone Encounter (Signed)
Patient called stating that mobic is not helping his pain. I contacted patient and explained that Dr. Wynn BankerKirsteins is on vacation and cannot address this in a timely manner.  Patient's narcotic treatment has been terminated.  I advised patient to seek treatment from PCP

## 2018-01-18 ENCOUNTER — Telehealth: Payer: Self-pay | Admitting: Physical Medicine & Rehabilitation

## 2018-01-18 NOTE — Telephone Encounter (Signed)
Contacted Nurse case manager to obtain clarity and inform that Dr. Wynn BankerKirsteins is out of town until Monday July 5th.  At first she indicated the need for a return to work note stipulating limitations. She then went on to report that the patient went to the ED at [facility] and a MRI was performed. Case manager will be sending results and [facility] so that Dr. Wynn BankerKirsteins may review.  She then stated to hold off on the work note until then.  She added that patient has been referred to Dr.Torrealba at the [facility] on [address]

## 2018-01-18 NOTE — Telephone Encounter (Signed)
WC case RN-Angela left mess that they need a work note for [employer] and [employer]Ethan Jacobs that states his "work limitation". The note he was given was that he attended his appt & could return to work.  I will be glad to fax that to Angela(WC rn) when it's available.  Thanks, Rosezella FloridaLisa M.

## 2018-01-20 NOTE — Telephone Encounter (Signed)
Any reason in particular this was sent to me?

## 2018-01-23 NOTE — Telephone Encounter (Signed)
Need report for MRI

## 2018-01-28 ENCOUNTER — Other Ambulatory Visit: Payer: Self-pay | Admitting: Physical Medicine & Rehabilitation

## 2018-01-30 ENCOUNTER — Telehealth: Payer: Self-pay | Admitting: *Deleted

## 2018-01-30 ENCOUNTER — Telehealth: Payer: Self-pay | Admitting: Physical Medicine & Rehabilitation

## 2018-01-30 NOTE — Telephone Encounter (Signed)
Patient left a message stating his pain is unmanaged with Mobic and is wondering if there was something else he could try.  He also stated he has not heard anything about his MRI and is woindering if Dr. Wynn BankerKirsteins has had a chance to review

## 2018-01-30 NOTE — Telephone Encounter (Signed)
I still do not have a copy of the MRI to review it is not on Care Everywhere.  Where was it done? I will need a hard copy on my desk , thanks

## 2018-01-30 NOTE — Telephone Encounter (Signed)
I left a voicemail on case managers Marlan Palau(Ahngela Eggers [phone]) phone to get copy of MRI sent to us.

## 2018-02-02 NOTE — Telephone Encounter (Signed)
Rec'd fax, placed on Dr Wynn BankerKirsteins desk for review

## 2018-02-02 NOTE — Telephone Encounter (Signed)
Copy of MRI report placed on Dr. Wynn BankerKirsteins desk for review.  Nurse case manager inquires about return to work status and limitations letter?  She asks if patient needs to be seen for this? As well as patient complains of unmanaged pain.

## 2018-02-02 NOTE — Telephone Encounter (Signed)
Can try flexeril 10mg  tid prn #45 no refill  Please fax copy to inpt rehab tomorrow first thing so I can review

## 2018-02-06 MED ORDER — CYCLOBENZAPRINE HCL 10 MG PO TABS: 10.0000 mg | ORAL_TABLET | Freq: Three times a day (TID) | ORAL | 0 refills | Status: DC | PRN

## 2018-02-06 NOTE — Telephone Encounter (Signed)
flexeril sent to pharmacy

## 2018-02-07 NOTE — Telephone Encounter (Signed)
Contacted case Production designer, theatre/television/filmmanager at [employer] and passed on Dr. Wynn BankerKirsteins advisement. Indicated that Dr. Wynn BankerKirsteins feels he does not have much else to offer due to limitation created by contract violation. I contacted patient and left a message as well in this regard.  I included that Flexeril has been ordered.

## 2018-02-07 NOTE — Telephone Encounter (Signed)
Dr. Wynn BankerKirsteins reviewed MRI report. His findings are no changes, may return to work, and no new restrictions. We will need to contact patient and Marlan PalauAhngela Eggers, Workers Comp, Sports coachCase Manager [phone]

## 2018-02-25 ENCOUNTER — Other Ambulatory Visit: Payer: Self-pay | Admitting: Physical Medicine & Rehabilitation

## 2018-03-12 ENCOUNTER — Other Ambulatory Visit: Payer: Self-pay | Admitting: Physical Medicine & Rehabilitation

## 2018-03-20 ENCOUNTER — Telehealth: Payer: Self-pay | Admitting: *Deleted

## 2018-03-20 MED ORDER — PREGABALIN 300 MG PO CAPS: 300.0000 mg | ORAL_CAPSULE | Freq: Two times a day (BID) | ORAL | 0 refills | Status: DC

## 2018-03-20 NOTE — Telephone Encounter (Signed)
I called Rx to pharmacy. He last filled it 03/02/18 so I have reinforced with Ethan Jacobs he cannot have medication until 03/30/18. He understands.

## 2018-03-20 NOTE — Telephone Encounter (Signed)
Patient requesting refill of of pregablin generic lyrica. Please advise

## 2018-03-27 ENCOUNTER — Other Ambulatory Visit: Payer: Self-pay | Admitting: Physical Medicine & Rehabilitation

## 2018-04-21 ENCOUNTER — Other Ambulatory Visit: Payer: Self-pay | Admitting: Physical Medicine & Rehabilitation

## 2018-04-21 NOTE — Telephone Encounter (Signed)
Need appt with NP in Jan 2020

## 2018-04-23 ENCOUNTER — Other Ambulatory Visit: Payer: Self-pay | Admitting: Physical Medicine & Rehabilitation

## 2018-04-27 ENCOUNTER — Ambulatory Visit (HOSPITAL_BASED_OUTPATIENT_CLINIC_OR_DEPARTMENT_OTHER): Payer: Worker's Compensation | Admitting: Physical Medicine & Rehabilitation

## 2018-04-27 ENCOUNTER — Encounter: Payer: Worker's Compensation | Attending: Physical Medicine & Rehabilitation

## 2018-04-27 ENCOUNTER — Encounter: Payer: Self-pay | Admitting: Physical Medicine & Rehabilitation

## 2018-04-27 VITALS — BP 144/95 | HR 70 | Ht 75.0 in | Wt 320.0 lb

## 2018-04-27 DIAGNOSIS — M961 Postlaminectomy syndrome, not elsewhere classified: Secondary | ICD-10-CM

## 2018-04-27 DIAGNOSIS — M5416 Radiculopathy, lumbar region: Secondary | ICD-10-CM

## 2018-04-27 MED ORDER — PREGABALIN 200 MG PO CAPS: 200.0000 mg | ORAL_CAPSULE | Freq: Three times a day (TID) | ORAL | 1 refills | Status: DC

## 2018-04-27 NOTE — Patient Instructions (Signed)
Please call in 1 month to see if you like the 200mg  three time a day Lyrica vs the 300mg  twice a day
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
## 2018-06-07 ENCOUNTER — Other Ambulatory Visit: Payer: Self-pay | Admitting: Physical Medicine & Rehabilitation

## 2018-06-11 ENCOUNTER — Other Ambulatory Visit: Payer: Self-pay | Admitting: Physical Medicine & Rehabilitation

## 2018-07-09 ENCOUNTER — Other Ambulatory Visit: Payer: Self-pay | Admitting: Physical Medicine & Rehabilitation

## 2018-07-10 ENCOUNTER — Other Ambulatory Visit: Payer: Self-pay | Admitting: *Deleted

## 2018-07-10 MED ORDER — PREGABALIN 200 MG PO CAPS: 200.0000 mg | ORAL_CAPSULE | Freq: Three times a day (TID) | ORAL | 3 refills | Status: DC

## 2018-07-10 NOTE — Telephone Encounter (Signed)
[facility] sent over a refill request for the lyrica 300 mg bid, but at last appt, Dr Wynn Banker changed to 200 mg tid #90 so I called in the order as he last prescribed and canceled the 300 mg request.

## 2018-07-12 ENCOUNTER — Telehealth: Payer: Self-pay

## 2018-07-12 NOTE — Telephone Encounter (Signed)
Please call in Lyrica 300mg  BID #[id number]

## 2018-07-12 NOTE — Telephone Encounter (Signed)
Called Lyrica 300 mg one twice a day #60 with 5 refills. [facility]

## 2018-07-12 NOTE — Telephone Encounter (Signed)
Pt called requesting refill on Lyrica. In last note 04/27/18 you indicated for the pt to call back in a month and let us know what works better for him Lyrica 200 mg three times a day versus Lyrica 300 mg twice a day. Pt stated to me today that Lyrica 300 mg twice a day works better for him. Is this ok to call in for him? Looks like Lyrica 200 mg was phoned in 07/10/2018 I can have them cancel and fill 300 mg if that is okay.

## 2018-07-16 ENCOUNTER — Other Ambulatory Visit: Payer: Self-pay | Admitting: Physical Medicine & Rehabilitation

## 2018-07-17 NOTE — Telephone Encounter (Signed)
Recieved electronic medication refill request for meloxicam 15mg , no mention in notes of this medication, unsure if ok to refill.  Please advise.

## 2018-07-17 NOTE — Telephone Encounter (Addendum)
Patient called back stating there is some confusion going on.  He states that he wants to to take Lyrica 200 mg capsules TID. Morning, noon, and dinner. Can we call the pharmacy and change his script to reflect Lyrica 200 mg capsules #90, refill 5?

## 2018-07-18 ENCOUNTER — Other Ambulatory Visit: Payer: Self-pay | Admitting: *Deleted

## 2018-07-18 MED ORDER — PREGABALIN 200 MG PO CAPS: 200.0000 mg | ORAL_CAPSULE | Freq: Three times a day (TID) | ORAL | 5 refills | Status: AC

## 2018-07-18 NOTE — Telephone Encounter (Signed)
Medication ordered

## 2018-07-18 NOTE — Telephone Encounter (Signed)
That is not what the last message said, ok to resume 200mg  TID 90 [id number]

## 2018-07-18 NOTE — Addendum Note (Signed)
Addended by: Angela Nevin D on: 07/18/2018 08:43 AM   Modules accepted: Orders

## 2018-10-26 ENCOUNTER — Ambulatory Visit: Payer: Self-pay | Admitting: Physical Medicine & Rehabilitation

## 2018-12-03 IMAGING — CR DG LUMBAR SPINE COMPLETE 4+V
5 series · 5 of 5 positions shown · non-contrast
Comparison: None.

CLINICAL DATA: Low back pain

EXAM:
LUMBAR SPINE - COMPLETE 4+ VIEW

[w lumbar spine ap]
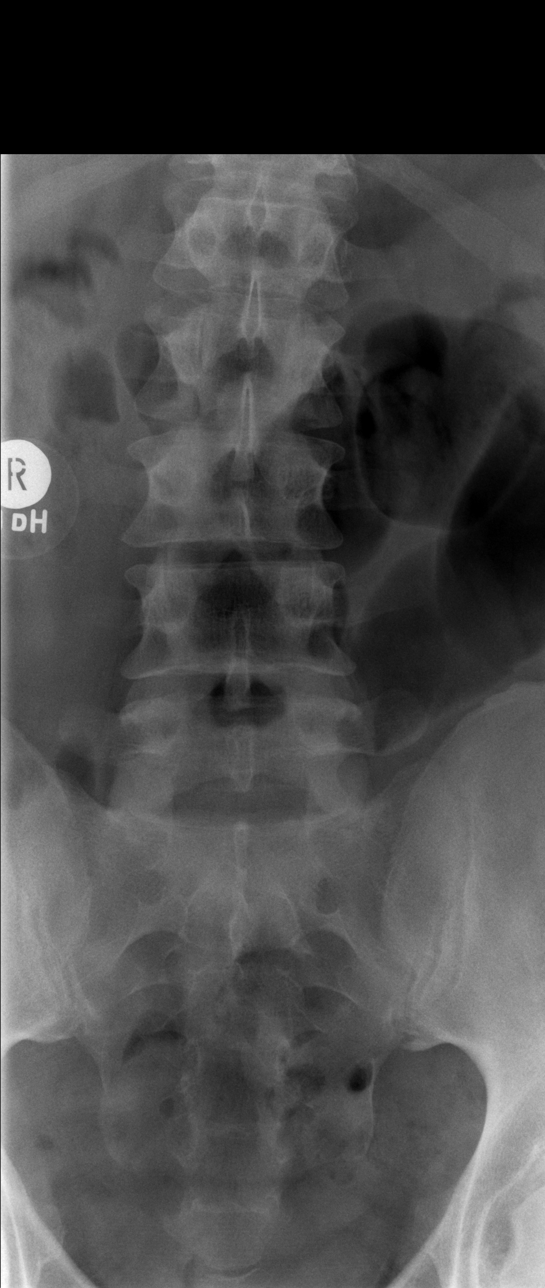

[w lumbar spine obl (1 of 2)]
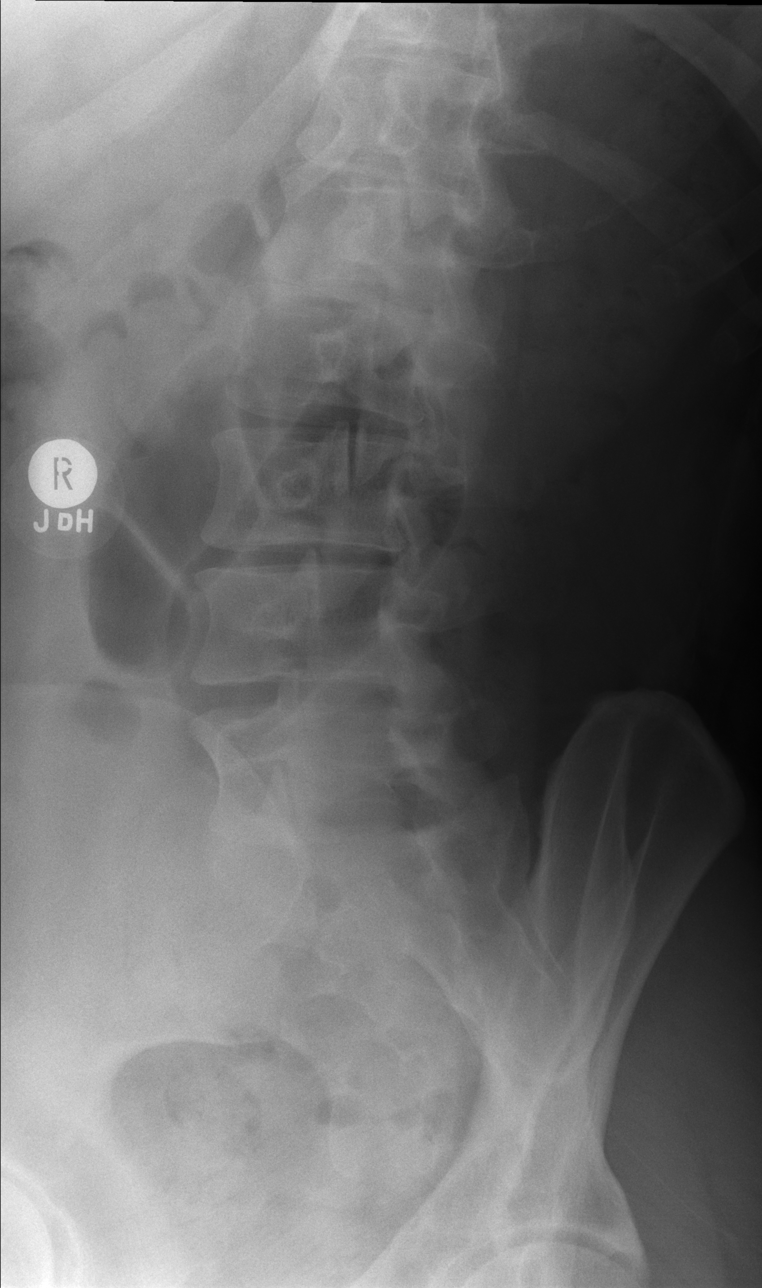

[w lumbar spine obl (2 of 2)]
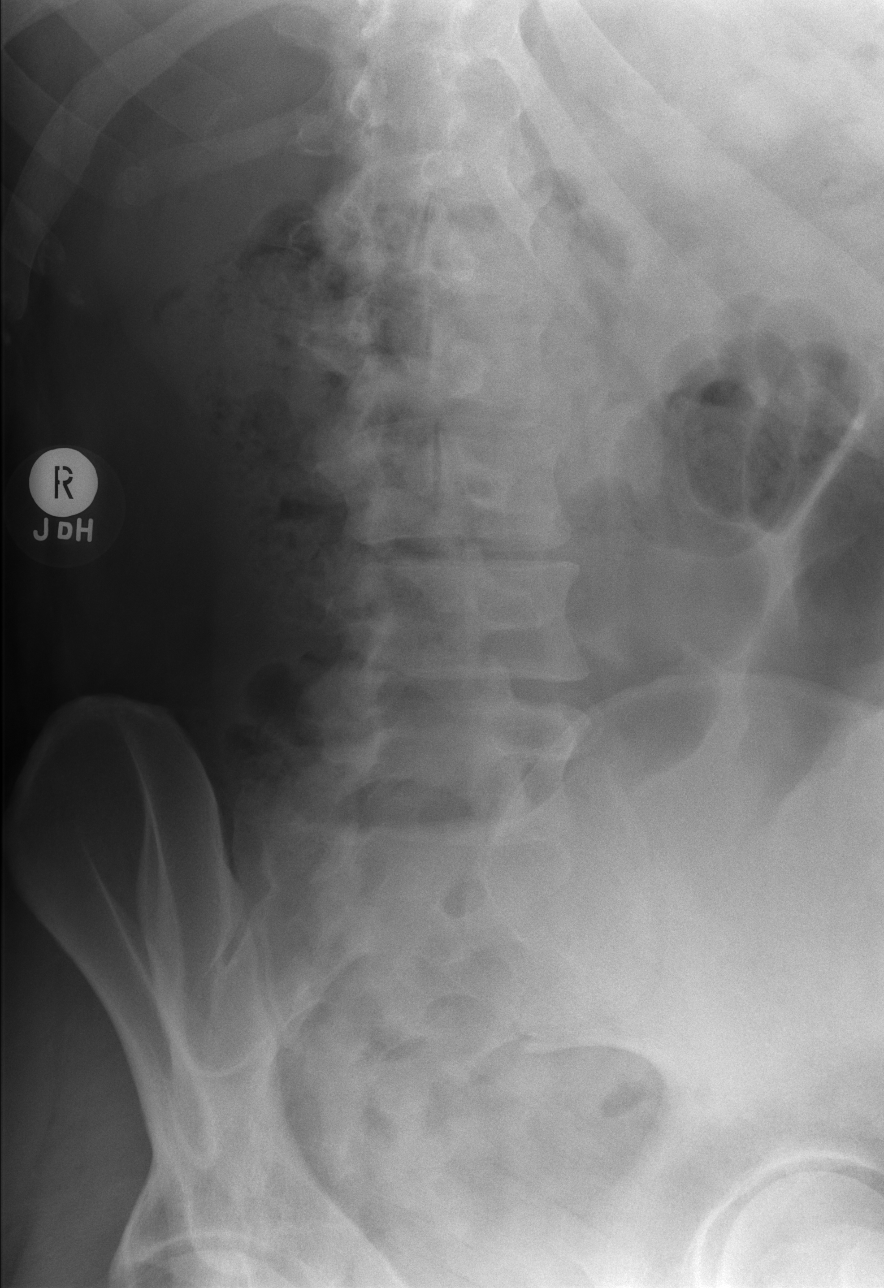

[w lumbar spine lat]
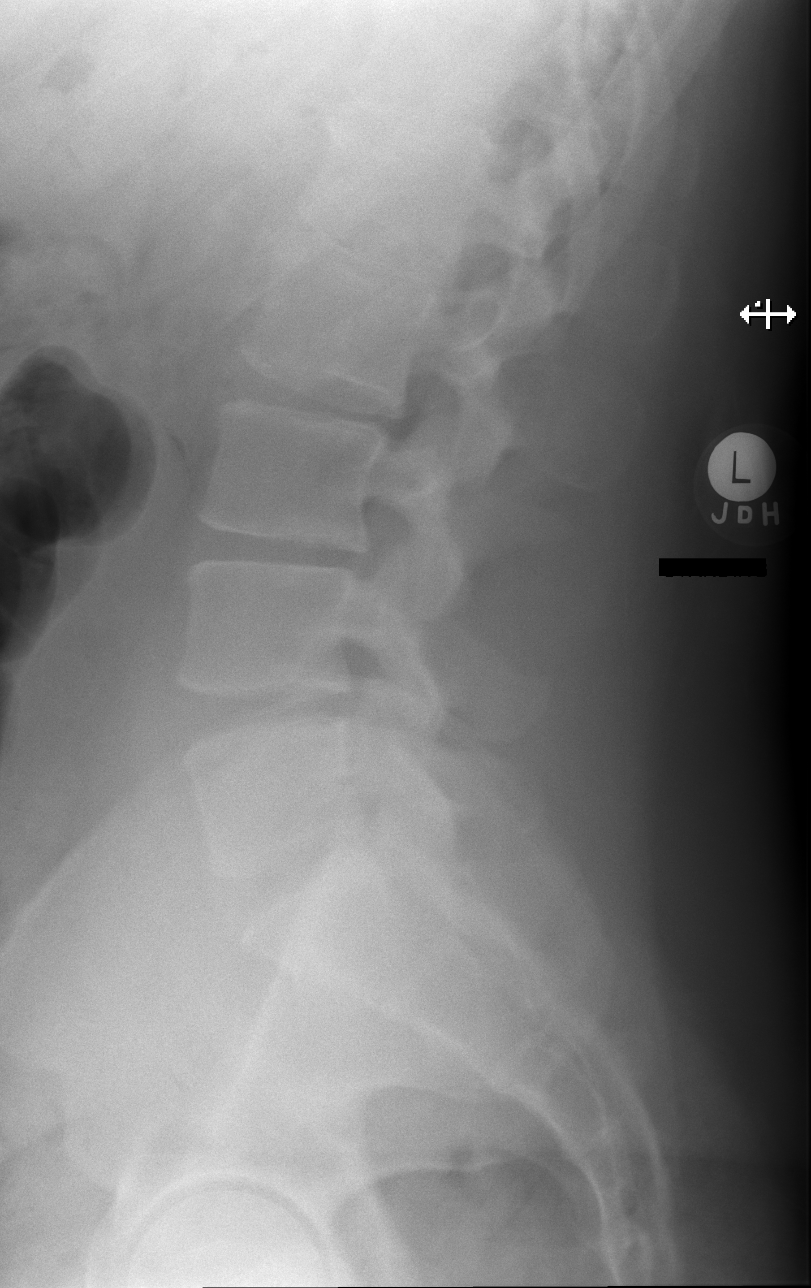

[w lumbar l-5 s-1 spot]
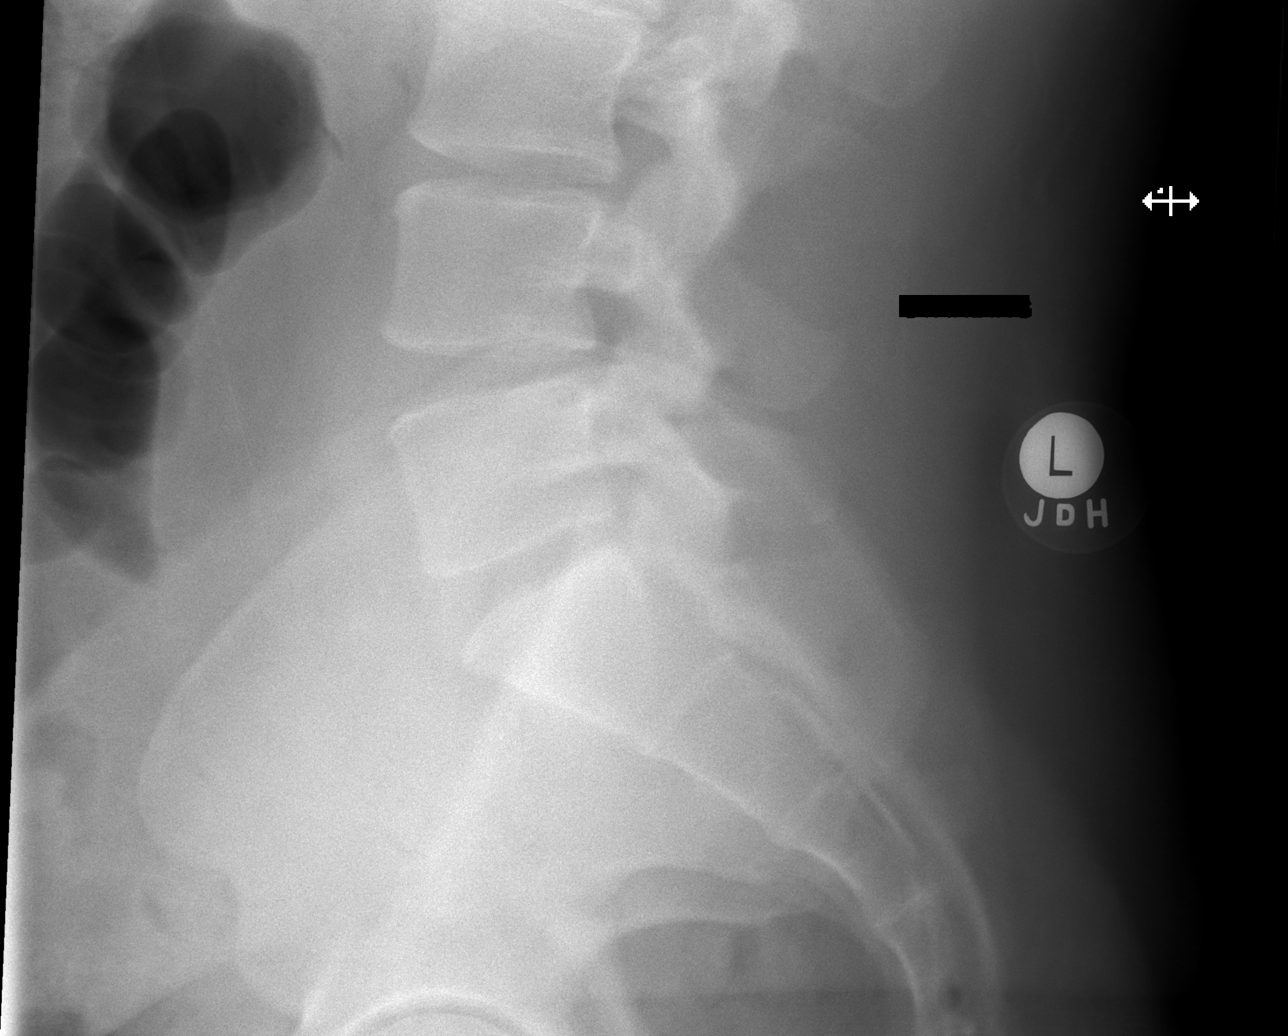

[5 of 5 positions shown; findings below may reference images not displayed]

FINDINGS: Normal alignment. No fracture or pars defect. Mild disc space
narrowing L3-4 and L4-5. Remaining disc spaces intact.
IMPRESSION: Mild disc degeneration L3-4 L4-5

## 2018-12-31 ENCOUNTER — Other Ambulatory Visit: Payer: Self-pay | Admitting: Physical Medicine & Rehabilitation

## 2019-01-22 ENCOUNTER — Telehealth: Payer: Self-pay

## 2019-01-22 NOTE — Telephone Encounter (Signed)
[facility] called stating they having been Pregabablin 200 mg capsules for patient from Dr. Letta Pate but he has also been prescribed Pregabalin 300 mg from Dr. Roselie Awkward. [facility] wants to know if you are aware.

## 2019-01-29 NOTE — Telephone Encounter (Signed)
[facility] is aware no further refills

## 2019-01-29 NOTE — Telephone Encounter (Signed)
No further refill of Lyrica from this office.

## 2019-02-09 ENCOUNTER — Other Ambulatory Visit: Payer: Self-pay

## 2019-02-09 ENCOUNTER — Emergency Department (HOSPITAL_COMMUNITY): Payer: No Typology Code available for payment source

## 2019-02-09 ENCOUNTER — Emergency Department (HOSPITAL_COMMUNITY)
Admission: EM | Admit: 2019-02-09 | Discharge: 2019-02-09 | Disposition: A | Discharge status: Home / Self Care | Payer: No Typology Code available for payment source | Attending: Emergency Medicine | Admitting: Emergency Medicine

## 2019-02-09 ENCOUNTER — Encounter (HOSPITAL_COMMUNITY): Payer: Self-pay

## 2019-02-09 DIAGNOSIS — Y939 Activity, unspecified: Secondary | ICD-10-CM | POA: Insufficient documentation

## 2019-02-09 DIAGNOSIS — I10 Essential (primary) hypertension: Secondary | ICD-10-CM | POA: Diagnosis not present

## 2019-02-09 DIAGNOSIS — F17210 Nicotine dependence, cigarettes, uncomplicated: Secondary | ICD-10-CM | POA: Diagnosis not present

## 2019-02-09 DIAGNOSIS — S0990XA Unspecified injury of head, initial encounter: Secondary | ICD-10-CM | POA: Insufficient documentation

## 2019-02-09 DIAGNOSIS — Y999 Unspecified external cause status: Secondary | ICD-10-CM | POA: Diagnosis not present

## 2019-02-09 DIAGNOSIS — Y929 Unspecified place or not applicable: Secondary | ICD-10-CM | POA: Diagnosis not present

## 2019-02-09 DIAGNOSIS — R51 Headache: Secondary | ICD-10-CM | POA: Insufficient documentation

## 2019-02-09 HISTORY — DX: Essential (primary) hypertension: I10

## 2019-02-09 LAB — RAPID URINE DRUG SCREEN, HOSP PERFORMED
Amphetamines: NOT DETECTED
Barbiturates: NOT DETECTED
Benzodiazepines: NOT DETECTED
Cocaine: NOT DETECTED
Opiates: NOT DETECTED
Tetrahydrocannabinol: NOT DETECTED

## 2019-02-09 MED ORDER — ACETAMINOPHEN 500 MG PO TABS
1000.0000 mg | ORAL_TABLET | Freq: Once | ORAL | Status: AC
  Administered 2019-02-09: 14:00:00 1000 mg via ORAL
  Filled 2019-02-09: qty 2
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
## 2019-02-09 NOTE — ED Notes (Signed)
Patient verbalizes understanding of discharge instructions. Opportunity for questioning and answers were provided. Pt discharged from ED. 

## 2019-02-09 NOTE — Discharge Instructions (Addendum)
You were evaluated in the Emergency Department and after careful evaluation, we did not find any emergent condition requiring admission or further testing in the hospital.  Your testing today is overall reassuring.  As discussed, please keep an eye out for the symptoms of concussion and practice mental and physical rest until they resolve.  Please return to the Emergency Department if you experience any worsening of your condition.  We encourage you to follow up with a primary care provider.  Thank you for allowing [place] to be a part of your care.

## 2019-02-09 NOTE — ED Notes (Signed)
IV removed from R hand; site clean, dry, intact
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
